# Patient Record
Sex: Female | Born: 1965 | Race: White | Hispanic: No | Marital: Single | Smoking: Current every day smoker
Health system: Southern US, Community
[De-identification: ages and names within clinical notes are randomized; demographics above are authoritative.]

## PROBLEM LIST (undated history)

## (undated) DIAGNOSIS — E119 Type 2 diabetes mellitus without complications: Secondary | ICD-10-CM

---

## 2017-12-02 ENCOUNTER — Encounter (HOSPITAL_COMMUNITY): Payer: Self-pay | Admitting: Emergency Medicine

## 2017-12-02 ENCOUNTER — Emergency Department (HOSPITAL_COMMUNITY): Payer: Medicare Other

## 2017-12-02 ENCOUNTER — Emergency Department (HOSPITAL_COMMUNITY)
Admission: EM | Admit: 2017-12-02 | Discharge: 2017-12-02 | Disposition: A | Discharge status: Home / Self Care | Payer: Medicare Other | Attending: Emergency Medicine | Admitting: Emergency Medicine

## 2017-12-02 DIAGNOSIS — R2681 Unsteadiness on feet: Secondary | ICD-10-CM | POA: Diagnosis not present

## 2017-12-02 DIAGNOSIS — R296 Repeated falls: Secondary | ICD-10-CM | POA: Diagnosis not present

## 2017-12-02 DIAGNOSIS — F17200 Nicotine dependence, unspecified, uncomplicated: Secondary | ICD-10-CM | POA: Diagnosis not present

## 2017-12-02 DIAGNOSIS — R110 Nausea: Secondary | ICD-10-CM

## 2017-12-02 DIAGNOSIS — E119 Type 2 diabetes mellitus without complications: Secondary | ICD-10-CM | POA: Diagnosis not present

## 2017-12-02 DIAGNOSIS — R42 Dizziness and giddiness: Secondary | ICD-10-CM | POA: Insufficient documentation

## 2017-12-02 DIAGNOSIS — Z7984 Long term (current) use of oral hypoglycemic drugs: Secondary | ICD-10-CM | POA: Diagnosis not present

## 2017-12-02 DIAGNOSIS — H539 Unspecified visual disturbance: Secondary | ICD-10-CM | POA: Diagnosis not present

## 2017-12-02 DIAGNOSIS — Z79899 Other long term (current) drug therapy: Secondary | ICD-10-CM | POA: Diagnosis not present

## 2017-12-02 DIAGNOSIS — R51 Headache: Secondary | ICD-10-CM | POA: Insufficient documentation

## 2017-12-02 DIAGNOSIS — R531 Weakness: Secondary | ICD-10-CM | POA: Insufficient documentation

## 2017-12-02 HISTORY — DX: Type 2 diabetes mellitus without complications: E11.9

## 2017-12-02 LAB — BASIC METABOLIC PANEL
Anion gap: 11 (ref 5–15)
BUN: 7 mg/dL (ref 6–20)
CHLORIDE: 103 mmol/L (ref 98–111)
CO2: 29 mmol/L (ref 22–32)
CREATININE: 0.63 mg/dL (ref 0.44–1.00)
Calcium: 8.9 mg/dL (ref 8.9–10.3)
GFR calc Af Amer: >60 mL/min (ref >60)
GFR calc non Af Amer: >60 mL/min (ref >60)
GLUCOSE: 114 mg/dL — AB (ref 70–99)
Potassium: 4 mmol/L (ref 3.5–5.1)
Sodium: 143 mmol/L (ref 135–145)

## 2017-12-02 LAB — BLOOD GAS, VENOUS
Acid-Base Excess: 3.5 mmol/L — ABNORMAL HIGH (ref 0.0–2.0)
BICARBONATE: 28.8 mmol/L — AB (ref 20.0–28.0)
O2 SAT: 89.6 %
PCO2 VEN: 48.7 mmHg (ref 44.0–60.0)
PO2 VEN: 60.8 mmHg — AB (ref 32.0–45.0)
Patient temperature: 98.6
pH, Ven: 7.389 (ref 7.250–7.430)

## 2017-12-02 LAB — CBC
HCT: 39.8 % (ref 36.0–46.0)
Hemoglobin: 12.8 g/dL (ref 12.0–15.0)
MCH: 28.6 pg (ref 26.0–34.0)
MCHC: 32.2 g/dL (ref 30.0–36.0)
MCV: 88.8 fL (ref 78.0–100.0)
PLATELETS: 123 10*3/uL — AB (ref 150–400)
RBC: 4.48 MIL/uL (ref 3.87–5.11)
RDW: 15.1 % (ref 11.5–15.5)
WBC: 3.6 10*3/uL — ABNORMAL LOW (ref 4.0–10.5)

## 2017-12-02 LAB — URINALYSIS, ROUTINE W REFLEX MICROSCOPIC
BILIRUBIN URINE: NEGATIVE
GLUCOSE, UA: NEGATIVE mg/dL
HGB URINE DIPSTICK: NEGATIVE
Ketones, ur: 5 mg/dL — AB
Leukocytes, UA: NEGATIVE
Nitrite: NEGATIVE
Protein, ur: NEGATIVE mg/dL
Specific Gravity, Urine: 1.01 (ref 1.005–1.030)
pH: 7 (ref 5.0–8.0)

## 2017-12-02 LAB — I-STAT TROPONIN, ED: Troponin i, poc: 0 ng/mL (ref 0.00–0.08)

## 2017-12-02 LAB — CBG MONITORING, ED: Glucose-Capillary: 107 mg/dL — ABNORMAL HIGH (ref 70–99)

## 2017-12-02 LAB — I-STAT BETA HCG BLOOD, ED (MC, WL, AP ONLY): I-stat hCG, quantitative: <5 m[IU]/mL (ref <5)

## 2017-12-02 MED ORDER — METOCLOPRAMIDE HCL 5 MG/ML IJ SOLN
10.0000 mg | Freq: Once | INTRAMUSCULAR | Status: AC
  Administered 2017-12-02: 10 mg via INTRAVENOUS
  Filled 2017-12-02: qty 2

## 2017-12-02 MED ORDER — SODIUM CHLORIDE 0.9 % IV SOLN
Freq: Once | INTRAVENOUS | Status: AC
  Administered 2017-12-02: 15:00:00 via INTRAVENOUS

## 2017-12-02 MED ORDER — DIPHENHYDRAMINE HCL 50 MG/ML IJ SOLN
25.0000 mg | Freq: Once | INTRAMUSCULAR | Status: AC
  Administered 2017-12-02: 25 mg via INTRAVENOUS
  Filled 2017-12-02: qty 1

## 2017-12-02 NOTE — ED Triage Notes (Signed)
Patient here from home with complaints of headache, dizziness, and blurred vision that started yesterday. Reports that she ran into two tables today at home. Nausea, no vomiting.

## 2017-12-02 NOTE — ED Notes (Signed)
Patient transported to MRI 

## 2017-12-02 NOTE — ED Provider Notes (Signed)
[facility]-EMERGENCY DEPT Provider Note   CSN: [id number] Arrival date & time: 12/02/17  1143     History   Chief Complaint Chief Complaint  Patient presents with  . Dizziness  . Headache    HPI Darlene Brown is a 52 y.o. female.  She presents here today with complaints of 2 weeks of nausea, 2 days of blurry vision, one day today waking up with dizziness that causes her to be unsteady on her feet and she is fallen a couple times.  She is got a low-grade frontal headache that she says is a band across her head.  She was recently admitted for pneumonia and is still coughing but feels improved from that.  She is never had these symptoms before.  Was very difficult  for her to describe dizziness its unsteadiness but also weakness she does feel some sensation of movement.  No chest pain no abdominal pain.  She is been nauseous but no vomiting no diarrhea.  The history is provided by the patient.  Dizziness  Quality:  Imbalance Severity:  Severe Onset quality:  Sudden (when awoke) Duration:  6 hours Timing:  Constant Progression:  Unchanged Chronicity:  New Context: head movement and standing up   Relieved by:  Nothing Worsened by:  Standing up Ineffective treatments:  None tried Associated symptoms: headaches, nausea and vision changes   Associated symptoms: no chest pain, no diarrhea, no hearing loss, no palpitations, no shortness of breath, no syncope, no tinnitus and no vomiting   Headache   Associated symptoms include nausea. Pertinent negatives include no fever, no palpitations, no syncope, no shortness of breath and no vomiting.    Past Medical History:  Diagnosis Date  . Diabetes mellitus without complication (HCC)     There are no active problems to display for this patient.   History reviewed. No pertinent surgical history.   OB History   None      Home Medications    Prior to Admission medications   Medication Sig Start Date End Date  Taking? Authorizing Provider  albuterol (PROAIR HFA) 108 (90 Base) MCG/ACT inhaler Inhale 90 mcg into the lungs every 6 (six) hours as needed for wheezing.   Yes [provider]  CHANTIX 1 MG tablet Take 1 mg by mouth 2 (two) times daily. After meals with a glass of water 11/12/17  Yes [provider]  clonazePAM (KLONOPIN) 2 MG tablet Take 2 mg by mouth 2 (two) times daily as needed for anxiety. 11/10/17  Yes [provider]  cyclobenzaprine (FLEXERIL) 10 MG tablet Take 10 mg by mouth 3 (three) times daily as needed for muscle spasms. 10/25/17  Yes [provider]  diltiazem (CARDIZEM CD) 240 MG 24 hr capsule Take 240 mg by mouth daily. 09/16/17  Yes [provider]  divalproex (DEPAKOTE ER) 250 MG 24 hr tablet Take 250 mg by mouth at bedtime. 11/21/17  Yes [provider]  divalproex (DEPAKOTE ER) 500 MG 24 hr tablet Take 1,000 mg by mouth at bedtime. 11/21/17  Yes [provider]  esomeprazole (NEXIUM) 40 MG capsule Take 40 mg by mouth daily. 10/08/17  Yes [provider]  Fluticasone-Salmeterol (ADVAIR DISKUS) 250-50 MCG/DOSE AEPB Inhale 2 puffs into the lungs 2 (two) times daily.   Yes [provider]  glipiZIDE (GLUCOTROL XL) 10 MG 24 hr tablet Take 10 mg by mouth daily. Only taking while on prednisone 10/25/17  Yes [provider]  lamoTRIgine (LAMICTAL) 150 MG  tablet Take 150 mg by mouth 2 (two) times daily. 11/27/17  Yes [provider]  LYRICA 150 MG capsule Take 150 mg by mouth 2 (two) times daily. 10/31/17  Yes [provider]  Melatonin 5 MG CAPS Take 10 mg by mouth at bedtime.   Yes [provider]  metFORMIN (GLUCOPHAGE) 500 MG tablet Take 1,000 mg by mouth 2 (two) times daily. 11/01/17  Yes [provider]  metoprolol succinate (TOPROL-XL) 50 MG 24 hr tablet Take 50 mg by mouth daily. Take with prednisone 10/30/17  Yes [provider]  montelukast (SINGULAIR) 10 MG  tablet Take 10 mg by mouth daily. 09/27/17  Yes [provider]  ondansetron (ZOFRAN) 4 MG tablet Take 4 mg by mouth 3 (three) times daily as needed for nausea/vomiting. 10/25/17  Yes [provider]  pravastatin (PRAVACHOL) 20 MG tablet Take 20 mg by mouth daily. 11/12/17  Yes [provider]  prazosin (MINIPRESS) 2 MG capsule Take 2 mg by mouth at bedtime. 11/27/17  Yes [provider]  SAPHRIS 5 MG SUBL 24 hr tablet Place 5 mg under the tongue at bedtime. 11/26/17  Yes [provider]  SUMAtriptan (IMITREX) 100 MG tablet Take 100 mg by mouth once. May repeat in 2 hours if headache persists or recurs.   Yes [provider]  tiotropium (SPIRIVA HANDIHALER) 18 MCG inhalation capsule Place 18 mcg into inhaler and inhale daily.   Yes [provider]  traZODone (DESYREL) 100 MG tablet Take 200 mg by mouth at bedtime. 10/31/17  Yes [provider]  TRINTELLIX 20 MG TABS tablet Take 20 mg by mouth daily. 11/29/17  Yes [provider]  zolpidem (AMBIEN) 10 MG tablet Take 10 mg by mouth at bedtime. 11/19/17  Yes [provider]    Family History No family history on file.  Social History Social History   Tobacco Use  . Smoking status: Current Every Day Smoker  . Smokeless tobacco: Never Used  Substance Use Topics  . Alcohol use: Never    Frequency: Never  . Drug use: Never     Allergies   Hydromorphone and Sulfa antibiotics   Review of Systems Review of Systems  Constitutional: Negative for chills and fever.  HENT: Negative for hearing loss and tinnitus.   Eyes: Positive for visual disturbance. Negative for pain.  Respiratory: Negative for shortness of breath.   Cardiovascular: Negative for chest pain, palpitations and syncope.  Gastrointestinal: Positive for nausea. Negative for diarrhea and vomiting.  Genitourinary: Negative for dysuria and hematuria.  Musculoskeletal: Positive for gait problem.  Negative for back pain and neck pain.  Skin: Negative for rash.  Neurological: Positive for dizziness, light-headedness and headaches. Negative for seizures and syncope.     Physical Exam Updated Vital Signs BP 123/72   Pulse 78   Temp 98.5 F (36.9 C) (Oral)   Resp 20   Ht 5\' 5"  (1.651 m)   Wt 95.3 kg (210 lb)   SpO2 97%   BMI 34.95 kg/m   Physical Exam  Constitutional: She is oriented to person, place, and time. She appears well-developed and well-nourished.  HENT:  Head: Normocephalic and atraumatic.  Eyes: Conjunctivae are normal.  Neck: Neck supple.  Cardiovascular: Normal rate and normal heart sounds.  No murmur heard. Pulmonary/Chest: Effort normal. No respiratory distress. She has no wheezes.  Abdominal: Soft. She exhibits no mass. There is no tenderness.  Musculoskeletal: Normal range of motion. She exhibits no edema or tenderness.  Neurological: She is alert and oriented to person, place, and time. She has normal strength. No cranial nerve deficit or sensory deficit. Coordination abnormal. GCS eye subscore is 4. GCS verbal subscore is 5. GCS motor subscore is 6.  Finger-to-nose she had difficulty.  Heel-to-shin seem normal.  Negative pronator drift.  Skin: Skin is warm and dry. No rash noted.  Psychiatric: She has a normal mood and affect.     ED Treatments / Results  Labs (all labs ordered are listed, but only abnormal results are displayed) Labs Reviewed  BASIC METABOLIC PANEL - Abnormal; Notable for the following components:      Result Value   Glucose, Bld 114 (*)    All other components within normal limits  CBC - Abnormal; Notable for the following components:   WBC 3.6 (*)    Platelets 123 (*)    All other components within normal limits  URINALYSIS, ROUTINE W REFLEX MICROSCOPIC - Abnormal; Notable for the following components:   Ketones, ur 5 (*)    All other components within normal limits  BLOOD GAS, VENOUS - Abnormal; Notable for the following  components:   pO2, Ven 60.8 (*)    Bicarbonate 28.8 (*)    Acid-Base Excess 3.5 (*)    All other components within normal limits  CBG MONITORING, ED - Abnormal; Notable for the following components:   Glucose-Capillary 107 (*)    All other components within normal limits  CBG MONITORING, ED  I-STAT BETA HCG BLOOD, ED ([facility], [facility], [facility] ONLY)  I-STAT TROPONIN, ED    EKG EKG Interpretation  Date/Time:  Sunday December 02 2017 12:01:22 EDT Ventricular Rate:  76 PR Interval:    QRS Duration: 91 QT Interval:  375 QTC Calculation: 422 R Axis:   60 Text Interpretation:  Sinus rhythm Prolonged PR interval Low voltage, precordial leads No previous ECGs available Confirmed by Frederick PeersLittle, Rachel [phone]([id number]) on 12/02/2017 4:12:07 PM   Radiology Dg Chest 1 View  Result Date: 12/02/2017 CLINICAL DATA:  Cough EXAM: CHEST  1 VIEW COMPARISON:  None. FINDINGS: Low volume chest with artifact from EKG leads. There is no edema, consolidation, effusion, or pneumothorax. Normal heart size. Atherosclerotic calcification. IMPRESSION: Low volume chest without acute finding. Electronically Signed   By: Marnee SpringJonathon  Watts M.D.   On: 12/02/2017 14:04   Mr Brain Wo Contrast  Result Date: 12/02/2017 CLINICAL DATA:  52 y/o F; headache, dizziness, blurred vision starting yesterday. EXAM: MRI HEAD WITHOUT CONTRAST TECHNIQUE: Multiplanar, multiecho pulse sequences of the brain and surrounding structures were obtained without intravenous contrast. COMPARISON:  None. FINDINGS: Brain: No acute infarction, hemorrhage, hydrocephalus, extra-axial collection or mass lesion. Few nonspecific punctate foci of T2 FLAIR hyperintensity are present in bilateral frontal subcortical white matter. Mild brain parenchymal volume loss. Vascular: Normal flow voids. Skull and upper cervical spine: Normal marrow signal. Sinuses/Orbits: Small right maxillary sinus mucous retention cyst. Partial right mastoid opacification. Orbits are unremarkable. Other: None.  IMPRESSION: 1. No acute intracranial abnormality identified. 2. Few nonspecific punctate foci of T2 FLAIR hyperintensity in bifrontal subcortical white matter may represent minimal chronic microvascular ischemic changes or possibly are migraine associated. 3. Mild brain parenchymal volume loss. Electronically Signed   By: Mitzi HansenLance  Furusawa-Stratton M.D.   On: 12/02/2017 17:55    Procedures Procedures (including critical care time)  Medications Ordered in ED Medications - No data to display   Initial Impression / Assessment and Plan / ED Course  I have reviewed the triage vital signs and the  nursing notes.  Pertinent labs & imaging results that were available during my care of the patient were reviewed by me and considered in my medical decision making (see chart for details).  Clinical Course as of Dec 05 1002  Sun Dec 02, 2017  1319 Patient states she was admitted a couple of weeks ago to the hospital in [facility]  near where she lives.  She is here locally to babysit her relative.   [MB]  1444 Reviewed the case with Dr. Amada Jupiter from neurology.  His recommendation is to get an MRI because ultimately other tests will still leave the question of whether this is an atypical stroke symptom.  I updated the patient with this and she is agreeable to plan.  She still feeling nauseous and still has a headache so we will try some Reglan and fluids and Benadryl.   [MB]    Clinical Course User Index [MB] Terrilee Files, MD   52 year old female with 2 weeks of nausea and more recent headache and dizziness which is somewhere between lightheadedness and some unsteadiness that may be from a sensation of movement.  She had some abnormal finger-to-nose but it was bilateral and her heel shin was normal and no pronator drift.  At time of signout to the oncoming physician she was pending a MRI of brain per discussions with neurology.  Disposition per results of MRI, if negative and  symptomatically improved she is possible for discharge.  Final Clinical Impressions(s) / ED Diagnoses   Final diagnoses:  Dizziness  Nausea  Vision changes    ED Discharge Orders    None       Terrilee Files, MD 12/04/17 1006

## 2017-12-02 NOTE — ED Provider Notes (Signed)
I received this patient in signout from Dr. Charm BargesButler.  We were awaiting imaging results after discussion with neurology, Dr. Amada JupiterKirkpatrick.  MRI showed no acute findings to explain the patient's symptoms.  Patient has been ambulatory here.  The remainder of her lab work including UA is unremarkable.  Discussed follow-up with PCP and extensively reviewed return precautions.  She voiced understanding.   Amiree No, Ambrose Finlandachel Morgan, MD 12/02/17 2021

## 2020-01-16 IMAGING — DX DG CHEST 1V
1 series · 1 of 1 positions shown · non-contrast
Comparison: None.

CLINICAL DATA: Cough

EXAM:
CHEST  1 VIEW

[chest ap]
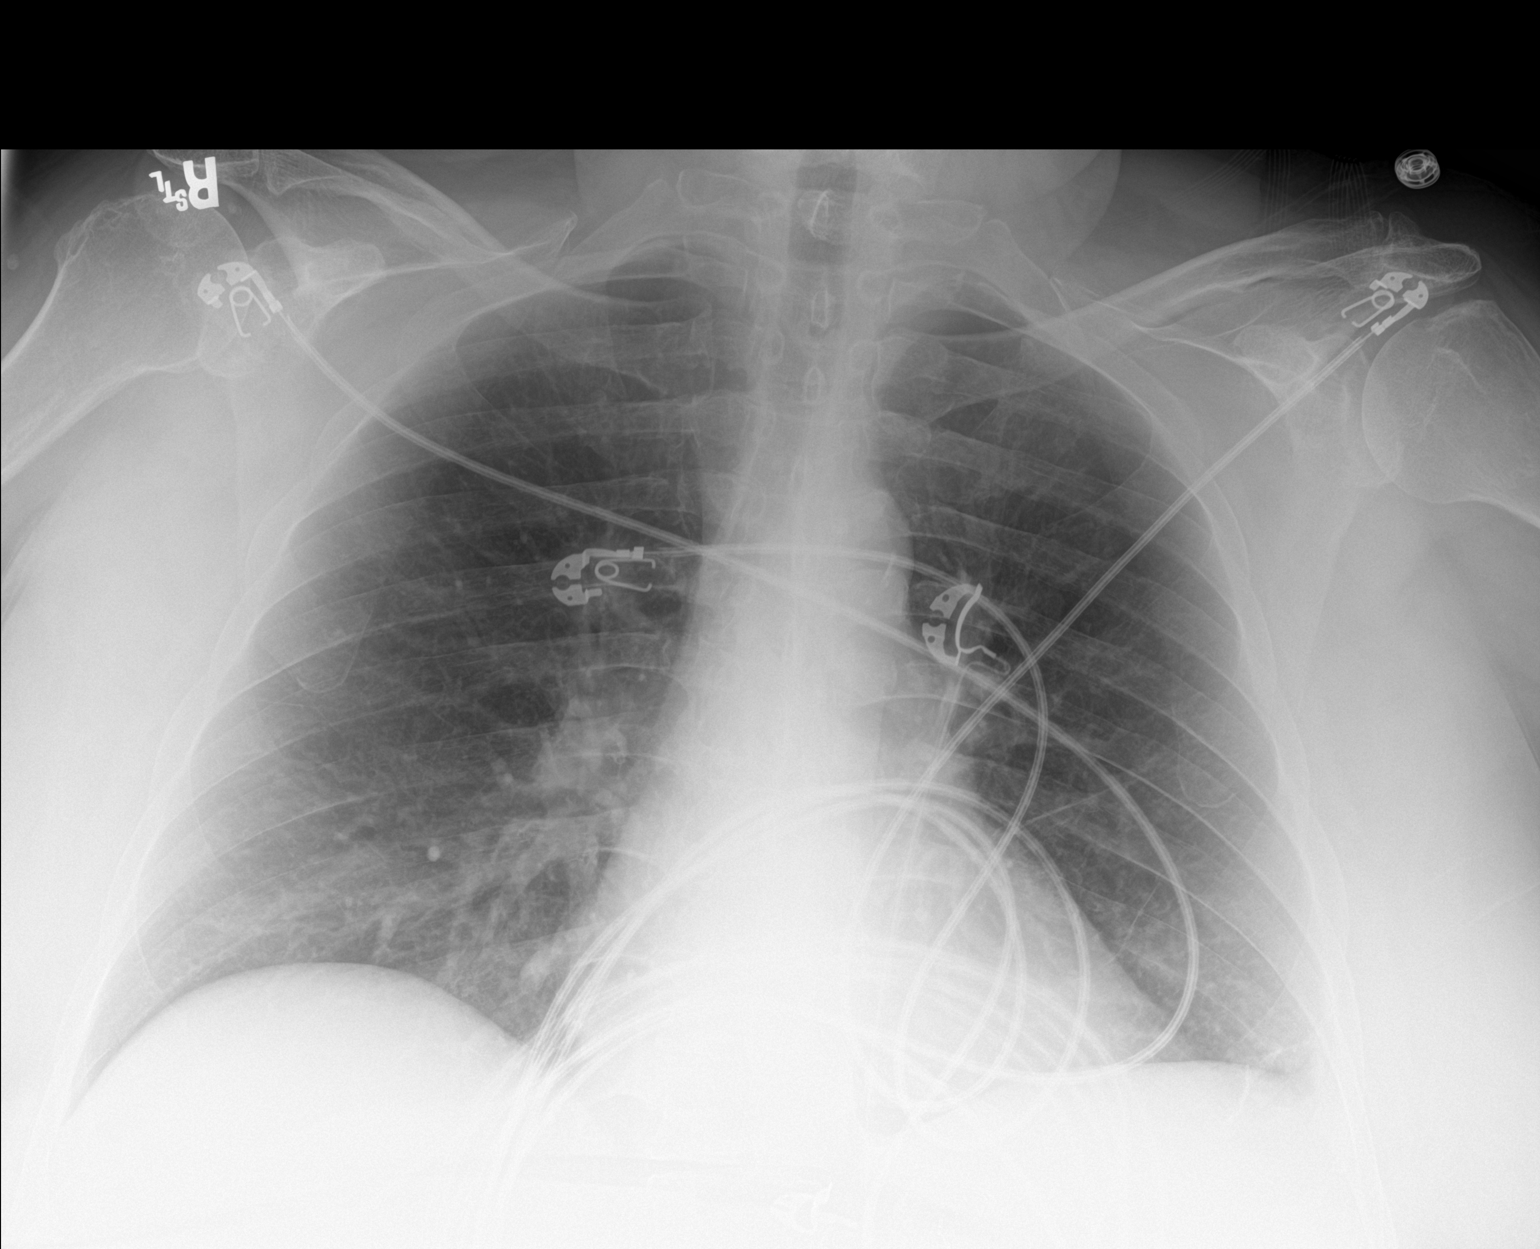

[1 of 1 positions shown; findings below may reference images not displayed]

FINDINGS: Low volume chest with artifact from EKG leads. There is no edema,
consolidation, effusion, or pneumothorax. Normal heart size.
Atherosclerotic calcification.
IMPRESSION: Low volume chest without acute finding.
